# Patient Record
Sex: Female | Born: 1965 | Race: White | Hispanic: No | Marital: Married | Smoking: Never smoker
Health system: Southern US, Community
[De-identification: ages and names within clinical notes are randomized; demographics above are authoritative.]

## PROBLEM LIST (undated history)

## (undated) DIAGNOSIS — K219 Gastro-esophageal reflux disease without esophagitis: Secondary | ICD-10-CM

## (undated) DIAGNOSIS — IMO0001 Reserved for inherently not codable concepts without codable children: Secondary | ICD-10-CM

## (undated) HISTORY — DX: Gastro-esophageal reflux disease without esophagitis: K21.9

## (undated) HISTORY — PX: BREAST REDUCTION SURGERY: SHX8

## (undated) HISTORY — PX: GANGLION CYST EXCISION: SHX1691

## (undated) HISTORY — PX: GALLBLADDER SURGERY: SHX652

## (undated) HISTORY — DX: Reserved for inherently not codable concepts without codable children: IMO0001

---

## 1998-01-14 ENCOUNTER — Other Ambulatory Visit: Admission: RE | Admit: 1998-01-14 | Discharge: 1998-01-14 | Payer: Self-pay | Admitting: *Deleted

## 1999-09-07 ENCOUNTER — Other Ambulatory Visit: Admission: RE | Admit: 1999-09-07 | Discharge: 1999-09-07 | Payer: Self-pay | Admitting: *Deleted

## 2001-03-26 ENCOUNTER — Other Ambulatory Visit: Admission: RE | Admit: 2001-03-26 | Discharge: 2001-03-26 | Payer: Self-pay | Admitting: *Deleted

## 2004-02-17 ENCOUNTER — Emergency Department: Payer: Self-pay | Admitting: Emergency Medicine

## 2004-06-21 ENCOUNTER — Other Ambulatory Visit: Admission: RE | Admit: 2004-06-21 | Discharge: 2004-06-21 | Payer: Self-pay | Admitting: Obstetrics and Gynecology

## 2011-04-09 ENCOUNTER — Emergency Department: Payer: Self-pay | Admitting: *Deleted

## 2013-01-28 ENCOUNTER — Encounter: Payer: Self-pay | Admitting: *Deleted

## 2013-01-29 ENCOUNTER — Encounter: Payer: Self-pay | Admitting: Podiatry

## 2013-01-29 ENCOUNTER — Ambulatory Visit (INDEPENDENT_AMBULATORY_CARE_PROVIDER_SITE_OTHER): Payer: BC Managed Care – PPO | Admitting: Podiatry

## 2013-01-29 VITALS — BP 114/82 | HR 112 | Resp 16 | Ht 64.0 in | Wt 188.0 lb

## 2013-01-29 DIAGNOSIS — M722 Plantar fascial fibromatosis: Secondary | ICD-10-CM | POA: Insufficient documentation

## 2013-01-29 NOTE — Progress Notes (Signed)
Kathleen Parker presents today for followup of her plantar fasciitis status post 1 month. She relates that they were 100% better.  Objective: Pulses are palpable to the bilateral lower extremity +2/4 DP and PT. Neurologic sensorium is intact. She has no pain on palpation U. continue tubercles bilateral.  Assessment: Well-healing plantar fasciitis bilateral.  Plan: Continue all conservative therapies x1 month; including night splint, tennis shoes, meloxicam, ice therapy and stretching exercises. Followup with me on a when necessary basis.

## 2013-02-26 ENCOUNTER — Other Ambulatory Visit: Payer: Self-pay

## 2013-11-13 ENCOUNTER — Ambulatory Visit: Payer: Self-pay | Admitting: Unknown Physician Specialty

## 2015-07-20 ENCOUNTER — Other Ambulatory Visit: Payer: Self-pay | Admitting: Physician Assistant

## 2015-07-20 DIAGNOSIS — J329 Chronic sinusitis, unspecified: Secondary | ICD-10-CM

## 2015-07-22 ENCOUNTER — Other Ambulatory Visit: Payer: Self-pay

## 2015-10-19 ENCOUNTER — Ambulatory Visit (INDEPENDENT_AMBULATORY_CARE_PROVIDER_SITE_OTHER): Payer: BC Managed Care – PPO

## 2015-10-19 ENCOUNTER — Ambulatory Visit (INDEPENDENT_AMBULATORY_CARE_PROVIDER_SITE_OTHER): Payer: BC Managed Care – PPO | Admitting: Podiatry

## 2015-10-19 ENCOUNTER — Encounter: Payer: Self-pay | Admitting: Podiatry

## 2015-10-19 VITALS — BP 125/78 | HR 106 | Resp 16

## 2015-10-19 DIAGNOSIS — M722 Plantar fascial fibromatosis: Secondary | ICD-10-CM

## 2015-10-19 MED ORDER — MELOXICAM 15 MG PO TABS: 15.0000 mg | ORAL_TABLET | Freq: Every day | ORAL | Status: DC

## 2015-10-19 MED ORDER — METHYLPREDNISOLONE 4 MG PO TBPK: ORAL_TABLET | ORAL | Status: DC

## 2015-10-19 NOTE — Patient Instructions (Signed)

## 2015-10-19 NOTE — Progress Notes (Signed)
She presents today with a chief complaint of a painful right heel as well as the left but the left is more tolerable. She states been going on for the past 2-3 weeks now denies any trauma.  Objective: Vital signs are stable she's alert and oriented 3 she has pain on palpation medial tubercle bilateral. Radiographs demonstrate thickening of the plantar fascia and plantar distally oriented heel spur right foot greater than left  Assessment: Pain secondary plantar fasciitis right greater than left.  Plan: Injected the right heel today dispensed a plantar fascia brace requested that she continue to utilize her night splint but a prescription for Medrol Dosepak to be followed by meloxicam. I will follow-up with her in 1 month.

## 2015-11-16 ENCOUNTER — Ambulatory Visit: Payer: BC Managed Care – PPO | Admitting: Podiatry

## 2015-11-22 ENCOUNTER — Telehealth: Payer: Self-pay | Admitting: *Deleted

## 2015-11-22 MED ORDER — MELOXICAM 15 MG PO TABS: 15.0000 mg | ORAL_TABLET | Freq: Every day | ORAL | 1 refills | Status: DC

## 2015-11-22 MED ORDER — MELOXICAM 15 MG PO TABS: 15.0000 mg | ORAL_TABLET | Freq: Every day | ORAL | 0 refills | Status: DC

## 2015-11-22 NOTE — Telephone Encounter (Signed)
Request for meloxicam 10m #90 day supply received.  Dr. Al Corpus states refill #90, +1refill.

## 2020-03-10 ENCOUNTER — Other Ambulatory Visit: Payer: Self-pay

## 2020-03-10 ENCOUNTER — Ambulatory Visit (INDEPENDENT_AMBULATORY_CARE_PROVIDER_SITE_OTHER): Payer: BC Managed Care – PPO

## 2020-03-10 ENCOUNTER — Ambulatory Visit: Payer: BC Managed Care – PPO | Admitting: Podiatry

## 2020-03-10 ENCOUNTER — Encounter: Payer: Self-pay | Admitting: Podiatry

## 2020-03-10 DIAGNOSIS — M7662 Achilles tendinitis, left leg: Secondary | ICD-10-CM

## 2020-03-10 DIAGNOSIS — M7661 Achilles tendinitis, right leg: Secondary | ICD-10-CM

## 2020-03-10 MED ORDER — MELOXICAM 15 MG PO TABS: 15.0000 mg | ORAL_TABLET | Freq: Every day | ORAL | 3 refills | Status: DC

## 2020-03-10 MED ORDER — METHYLPREDNISOLONE 4 MG PO TBPK: ORAL_TABLET | ORAL | 0 refills | Status: AC

## 2020-03-10 NOTE — Progress Notes (Signed)
  Subjective:  Patient ID: Kathleen Parker, female    DOB: 02-12-1966,  MRN: [id number] HPI Chief Complaint  Patient presents with  . Foot Pain    Posterior heel/achilles bilateral - aching x 4 months, AM pain-stiffness, tried ibuprofen, stretching, decreased activity, and night splint  . New Patient (Initial Visit)    Est pt 2017    54 y.o. female presents with the above complaint.   ROS: She denies fever chills nausea vomiting muscle aches pains calf pain back pain chest pain shortness of breath.  States that this has been going on since 11-16-22 when her aunt died in [place] and she had to go down and start cleaning out her house.  Past Medical History:  Diagnosis Date  . Reflux    Past Surgical History:  Procedure Laterality Date  . BREAST REDUCTION SURGERY    . GALLBLADDER SURGERY    . GANGLION CYST EXCISION Right     Current Outpatient Medications:  .  meloxicam (MOBIC) 15 MG tablet, Take 1 tablet (15 mg total) by mouth daily., Disp: 30 tablet, Rfl: 3 .  methylPREDNISolone (MEDROL DOSEPAK) 4 MG TBPK tablet, 6 day dose pack - take as directed, Disp: 21 tablet, Rfl: 0  Allergies  Allergen Reactions  . Codeine Other (See Comments)    KEEPS AWAKE    Review of Systems Objective:  There were no vitals filed for this visit.  General: Well developed, nourished, in no acute distress, alert and oriented x3   Dermatological: Skin is warm, dry and supple bilateral. Nails x 10 are well maintained; remaining integument appears unremarkable at this time. There are no open sores, no preulcerative lesions, no rash or signs of infection present.  Vascular: Dorsalis Pedis artery and Posterior Tibial artery pedal pulses are 2/4 bilateral with immedate capillary fill time. Pedal hair growth present. No varicosities and no lower extremity edema present bilateral.   Neruologic: Grossly intact via light touch bilateral. Vibratory intact via tuning fork bilateral. Protective threshold with  Semmes Wienstein monofilament intact to all pedal sites bilateral. Patellar and Achilles deep tendon reflexes 2+ bilateral. No Babinski or clonus noted bilateral.   Musculoskeletal: No gross boney pedal deformities bilateral. No pain, crepitus, or limitation noted with foot and ankle range of motion bilateral. Muscular strength 5/5 in all groups tested bilateral.  She presents with pain on palpation to the posterior superior calcaneus the Achilles is thickened but the margins are intact there does not appear to be a void or tear.  There is no fluctuance subcutaneously that no noticed there is a small blister on the posterior aspect of her left heel does not appear to be infected.  Gait: Unassisted, Nonantalgic.    Radiographs:  Radiographs of the bilateral foot demonstrate an osseously mature individual with some retrocalcaneal heel spurs proximally oriented.  Thickening of the Achilles tendon at the posterior superior aspect of the calcaneus.  No intratendinous calcifications are noted.  Assessment & Plan:   Assessment: Achilles tendinitis bilateral.  Plan: Injected subcutaneously 2 mg of dexamethasone local anesthetic to the point of maximal tenderness.  Start her on a Medrol Dosepak to be followed by meloxicam.  Discussed appropriate shoe gear stretching exercise ice therapy and shoe gear modifications.  I will follow-up with her in 1 month.  She will call sooner with questions or concerns.     Kathleen Parker T. [place], [place]

## 2020-03-10 NOTE — Patient Instructions (Signed)

## 2020-04-12 ENCOUNTER — Ambulatory Visit: Payer: BC Managed Care – PPO | Admitting: Podiatry

## 2020-04-27 ENCOUNTER — Other Ambulatory Visit: Payer: Self-pay | Admitting: Obstetrics and Gynecology

## 2020-04-27 DIAGNOSIS — R928 Other abnormal and inconclusive findings on diagnostic imaging of breast: Secondary | ICD-10-CM

## 2020-05-04 ENCOUNTER — Other Ambulatory Visit: Payer: Self-pay

## 2020-05-04 ENCOUNTER — Ambulatory Visit
Admission: RE | Admit: 2020-05-04 | Discharge: 2020-05-04 | Disposition: A | Discharge status: Home / Self Care | Payer: BC Managed Care – PPO | Source: Ambulatory Visit | Attending: Obstetrics and Gynecology | Admitting: Obstetrics and Gynecology

## 2020-05-04 DIAGNOSIS — R928 Other abnormal and inconclusive findings on diagnostic imaging of breast: Secondary | ICD-10-CM

## 2020-10-18 ENCOUNTER — Ambulatory Visit: Payer: BC Managed Care – PPO | Admitting: Podiatry

## 2020-10-18 ENCOUNTER — Ambulatory Visit (INDEPENDENT_AMBULATORY_CARE_PROVIDER_SITE_OTHER): Payer: BC Managed Care – PPO

## 2020-10-18 ENCOUNTER — Other Ambulatory Visit: Payer: Self-pay

## 2020-10-18 ENCOUNTER — Encounter: Payer: Self-pay | Admitting: Podiatry

## 2020-10-18 DIAGNOSIS — S82892A Other fracture of left lower leg, initial encounter for closed fracture: Secondary | ICD-10-CM

## 2020-10-18 NOTE — Progress Notes (Signed)
Subjective:  Patient ID: Kathleen Parker, female    DOB: [birth year],  MRN: [id number]  Chief Complaint  Patient presents with   Foot Pain    Left ankle pain on the outside of the ankle  PT stated that on Sunday she fell down some stairs     55  y.o. female presents with the above complaint.  Patient presents with primary complaint of left ankle pain after she fell down some steps and had a lot of swelling outside of her ankle.  She has been able to walk on it without any acute issues.  She has mild pain but mostly swelling that she is concerned for.  She would like to get this evaluated.  She is known to Dr. and has been treated for Achilles tendinitis in the past.  Her date of injury happened this past SaturdayOn October 15, 2020.  She denies any other acute complaints and would like to discuss treatment options for this.  She did not go to the urgent care.  She denies any other polytrauma sites.   Review of Systems: Negative except as noted in the HPI. Denies N/V/F/Ch.  Past Medical History:  Diagnosis Date   Reflux     Current Outpatient Medications:    meloxicam (MOBIC) 15 MG tablet, Take 1 tablet (15 mg total) by mouth daily., Disp: 30 tablet, Rfl: 3   methylPREDNISolone (MEDROL DOSEPAK) 4 MG TBPK tablet, 6 day dose pack - take as directed, Disp: 21 tablet, Rfl: 0  Social History   Tobacco Use  Smoking Status Never  Smokeless Tobacco Never    Allergies  Allergen Reactions   Codeine Other (See Comments)    KEEPS AWAKE    Objective:  There were no vitals filed for this visit. There is no height or weight on file to calculate BMI. Constitutional Well developed. Well nourished.  Vascular Dorsalis pedis pulses palpable bilaterally. Posterior tibial pulses palpable bilaterally. Capillary refill normal to all digits.  No cyanosis or clubbing noted. Pedal hair growth normal.  Neurologic Normal speech. Oriented to person, place, and time. Epicritic sensation to  light touch grossly present bilaterally.  Dermatologic Nails well groomed and normal in appearance. No open wounds. No skin lesions.  Orthopedic: Pain on palpation to the left ankle.  Pain with range of motion of the ankle joint.  1+ nonpitting pitting edema noted.  Mild ecchymosis noted.  No fracture blisters noted.  Achilles tendon within normal limits.  No pain on the medial aspect of the ankle joint.   Radiographs: 3 views of skeletally mature adult left ankle.  SER 2 ankle fracture noted with spiral oblique fracture of the fibula.  Greater than 2 mm displacement.  No displacement noted of the medial malleolus site.  No gapping noted of the medial clear space.  Ankle mortise within normal limits. Assessment:   1. Ankle fracture, left, closed, initial encounter    Plan:  Patient was evaluated and treated and all questions answered.  Left SER 2 ankle fracture without increase in medial clear space -All questions and concerns were discussed with the patient in extensive detail.  I discussed with her that at this time we can manage conservatively and nonoperatively if there is no gapping in the syndesmosis as well as the fracture site.  At this time she is very swollen and therefore she will benefit from compression dressing.  Compression dressing was applied in standard technique. -She will ambulate in cam boot for next 2 weeks and we  will reassess and come in for repeat x-ray of the ankle.  If there is gapping or further displacement of the fibula or gapping of the syndesmosis we will discuss surgical options at that time.  I discussed this with the patient extensive detail she states understanding. -Meloxicam for pain control -  No follow-ups on file.

## 2020-11-01 ENCOUNTER — Ambulatory Visit (INDEPENDENT_AMBULATORY_CARE_PROVIDER_SITE_OTHER): Payer: BC Managed Care – PPO

## 2020-11-01 ENCOUNTER — Other Ambulatory Visit: Payer: Self-pay

## 2020-11-01 ENCOUNTER — Ambulatory Visit: Payer: BC Managed Care – PPO | Admitting: Podiatry

## 2020-11-01 DIAGNOSIS — S82892A Other fracture of left lower leg, initial encounter for closed fracture: Secondary | ICD-10-CM

## 2020-11-02 ENCOUNTER — Encounter: Payer: Self-pay | Admitting: Podiatry

## 2020-11-02 MED ORDER — DOXYCYCLINE HYCLATE 100 MG PO TABS: 100.0000 mg | ORAL_TABLET | Freq: Two times a day (BID) | ORAL | 0 refills | Status: AC

## 2020-11-02 NOTE — Progress Notes (Signed)
  Subjective:  Patient ID: Kathleen Parker, female    DOB: Aug 02, 1965,  MRN: [id number]  Chief Complaint  Patient presents with   Fracture    Left foot PT stated that she is doing well she stated that some of the swelling has gone down some she does have some numbness on the top of her foot that she is concerned about today.     55 y.o. female presents with the above complaint.  Patient presents with follow-up to left ankle fracture.  Patient states she is doing little bit better.  She has been noncompliant with management.  She states that there is some pain associated with it.  She denies any other acute complaints.  There is some redness and swelling as well.   Review of Systems: Negative except as noted in the HPI. Denies N/V/F/Ch.  Past Medical History:  Diagnosis Date   Reflux     Current Outpatient Medications:    meloxicam (MOBIC) 15 MG tablet, Take 1 tablet (15 mg total) by mouth daily., Disp: 30 tablet, Rfl: 3   methylPREDNISolone (MEDROL DOSEPAK) 4 MG TBPK tablet, 6 day dose pack - take as directed, Disp: 21 tablet, Rfl: 0  Social History   Tobacco Use  Smoking Status Never  Smokeless Tobacco Never    Allergies  Allergen Reactions   Codeine Other (See Comments)    KEEPS AWAKE    Objective:  There were no vitals filed for this visit. There is no height or weight on file to calculate BMI. Constitutional Well developed. Well nourished.  Vascular Dorsalis pedis pulses palpable bilaterally. Posterior tibial pulses palpable bilaterally. Capillary refill normal to all digits.  No cyanosis or clubbing noted. Pedal hair growth normal.  Neurologic Normal speech. Oriented to person, place, and time. Epicritic sensation to light touch grossly present bilaterally.  Dermatologic Nails well groomed and normal in appearance. No open wounds. No skin lesions.  Orthopedic: Pain on palpation to the left ankle.  Pain with range of motion of the ankle joint.  1+  nonpitting pitting edema noted.  Mild ecchymosis noted.  No fracture blisters noted.  Achilles tendon within normal limits.  No pain on the medial aspect of the ankle joint.  Mild redness and ecchymosis noted   Radiographs: 3 views of skeletally mature adult left ankle.  SER 2 ankle fracture noted with spiral oblique fracture of the fibula.  Greater than 2 mm displacement.  No further displacement noted of the medial malleolus site.  No further gapping noted of the medial clear space.  Ankle mortise within normal limits. Assessment:   1. Ankle fracture, left, closed, initial encounter    Plan:  Patient was evaluated and treated and all questions answered.  Left SER 2 ankle fracture without increase in medial clear space -All questions and concerns were discussed with the patient in extensive detail.   -Continue ice and compression. -Repeat x-ray does not show any kind of instability.  There is no further displacement noted.  At this time we will discuss conservative treatment options with nonweightbearing to the left lower extremity with Cam boot. -I discussed with the patient extensive detail intake 8 to 12 weeks to completely heal.  Patient states understanding -Doxycycline was dispensed for skin and soft tissue prophylaxis  No follow-ups on file.

## 2020-12-15 ENCOUNTER — Ambulatory Visit: Payer: BC Managed Care – PPO | Admitting: Podiatry

## 2020-12-15 ENCOUNTER — Ambulatory Visit (INDEPENDENT_AMBULATORY_CARE_PROVIDER_SITE_OTHER): Payer: BC Managed Care – PPO

## 2020-12-15 ENCOUNTER — Other Ambulatory Visit: Payer: Self-pay

## 2020-12-15 DIAGNOSIS — S82892A Other fracture of left lower leg, initial encounter for closed fracture: Secondary | ICD-10-CM

## 2020-12-16 ENCOUNTER — Telehealth: Payer: Self-pay

## 2020-12-16 NOTE — Telephone Encounter (Signed)
Patient called, she was seen yesterday and you took her out of her boot.  She wanted to know when she could start going back to the gym    Please advise.  She knows it will be Monday before I will get back with her.  Thanks

## 2020-12-19 NOTE — Telephone Encounter (Signed)
Patient has been notified of Dr. Eliane Decree instructions for going back to the gym.  She verbalized understanding

## 2020-12-20 NOTE — Progress Notes (Signed)
  Subjective:  Patient ID: Kathleen Parker, female    DOB: 1965-12-16,  MRN: [id number]  Chief Complaint  Patient presents with   Fracture    Left foot fracture PT stated that she still has some swelling    55 y.o. female presents with the above complaint.  Patient presents with follow-up to left ankle fracture.  Patient states she is doing a lot better.  No pain.  She has been ambulating with a cam boot for the last few weeks.   Review of Systems: Negative except as noted in the HPI. Denies N/V/F/Ch.  Past Medical History:  Diagnosis Date   Reflux     Current Outpatient Medications:    doxycycline (VIBRA-TABS) 100 MG tablet, Take 1 tablet (100 mg total) by mouth 2 (two) times daily., Disp: 10 tablet, Rfl: 0   meloxicam (MOBIC) 15 MG tablet, Take 1 tablet (15 mg total) by mouth daily., Disp: 30 tablet, Rfl: 3   methylPREDNISolone (MEDROL DOSEPAK) 4 MG TBPK tablet, 6 day dose pack - take as directed, Disp: 21 tablet, Rfl: 0  Social History   Tobacco Use  Smoking Status Never  Smokeless Tobacco Never    Allergies  Allergen Reactions   Codeine Other (See Comments)    KEEPS AWAKE    Objective:  There were no vitals filed for this visit. There is no height or weight on file to calculate BMI. Constitutional Well developed. Well nourished.  Vascular Dorsalis pedis pulses palpable bilaterally. Posterior tibial pulses palpable bilaterally. Capillary refill normal to all digits.  No cyanosis or clubbing noted. Pedal hair growth normal.  Neurologic Normal speech. Oriented to person, place, and time. Epicritic sensation to light touch grossly present bilaterally.  Dermatologic Nails well groomed and normal in appearance. No open wounds. No skin lesions.  Orthopedic: Pain on palpation to the left ankle.  Pain with range of motion of the ankle joint.  1+ nonpitting pitting edema noted.  Mild ecchymosis noted.  No fracture blisters noted.  Achilles tendon within normal  limits.  No pain on the medial aspect of the ankle joint.  Mild redness and ecchymosis noted   Radiographs: 3 views of skeletally mature adult left ankle.  SER 2 ankle fracture noted with spiral oblique fracture of the fibula.  Consolidation noted across the fracture site.  Callus formation noted.  No further gapping of the medial clear space noted. Assessment:   1. Ankle fracture, left, closed, initial encounter    Plan:  Patient was evaluated and treated and all questions answered.  Left SER 2 ankle fracture without increase in medial clear space -All questions and concerns were discussed with the patient in extensive detail.   -Continue ice and compression. -Repeat x-ray shows continued signs of healing without any instability.  She can start transitioning to weightbearing as tolerated in regular shoes.  The fracture should be stable at this point.  Clinically she does not have any pain therefore I feel comfortable transitioning her. -I discussed with her if any foot and ankle issues I will place her self back in the boot.  She states understanding No follow-ups on file.

## 2021-03-01 ENCOUNTER — Telehealth: Payer: Self-pay | Admitting: Podiatry

## 2021-03-01 MED ORDER — MELOXICAM 15 MG PO TABS: 15.0000 mg | ORAL_TABLET | Freq: Every day | ORAL | 3 refills | Status: DC

## 2021-03-01 NOTE — Telephone Encounter (Signed)
Patient called asking for RX refill for Meloxican. Patient uses [address] in [place]. Patients number is [phone].

## 2021-03-10 ENCOUNTER — Telehealth: Payer: Self-pay | Admitting: Podiatry

## 2021-03-10 NOTE — Telephone Encounter (Signed)
Patient called stating she wants a Meloxicam refill. Patient uses [facility] in [place]. Pt tele number [phone].

## 2021-04-25 ENCOUNTER — Ambulatory Visit: Payer: BC Managed Care – PPO | Admitting: Podiatry

## 2021-05-05 IMAGING — US US BREAST*L* LIMITED INC AXILLA
1 series · 7 of 7 positions shown · non-contrast
Comparison: Previous exams including recent screening mammogram
dated 04/25/2020.

CLINICAL DATA: Patient returns today to evaluate a possible LEFT
breast mass identified on recent screening mammogram.

EXAM:
DIGITAL DIAGNOSTIC LEFT MAMMOGRAM WITH CAD AND TOMO
ULTRASOUND LEFT BREAST

[Series 1: us breast*left* limited inc axilla · 0.06mm/px · 7 of 7 slices shown]
[im 1/7]
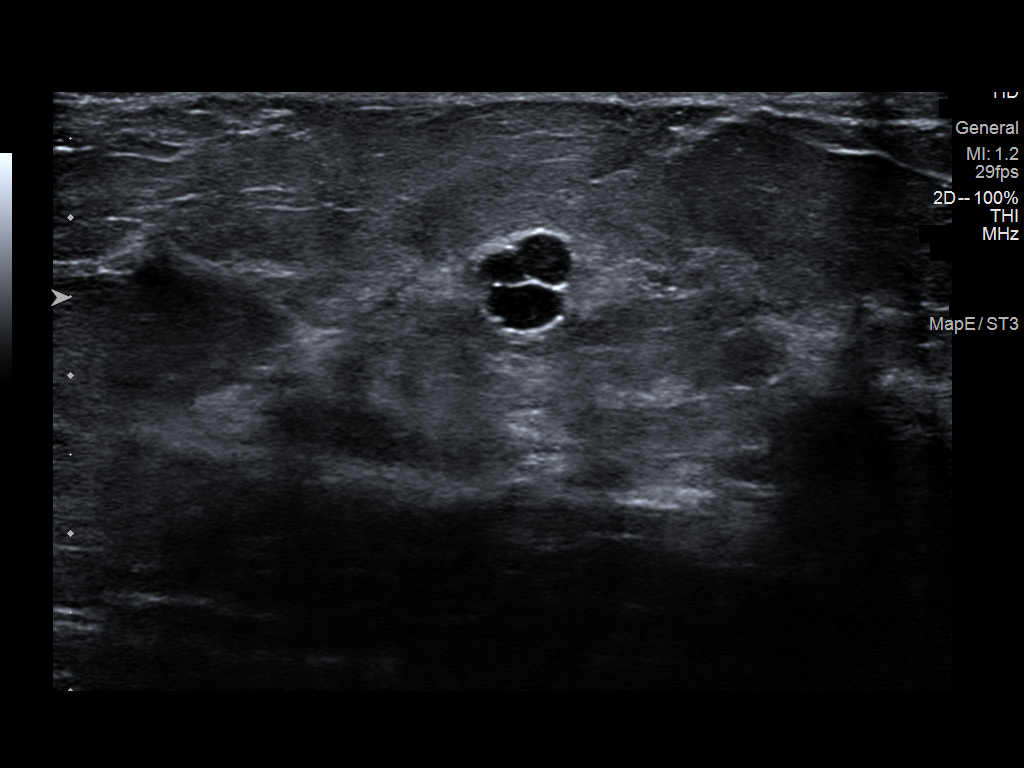
[im 2/7]
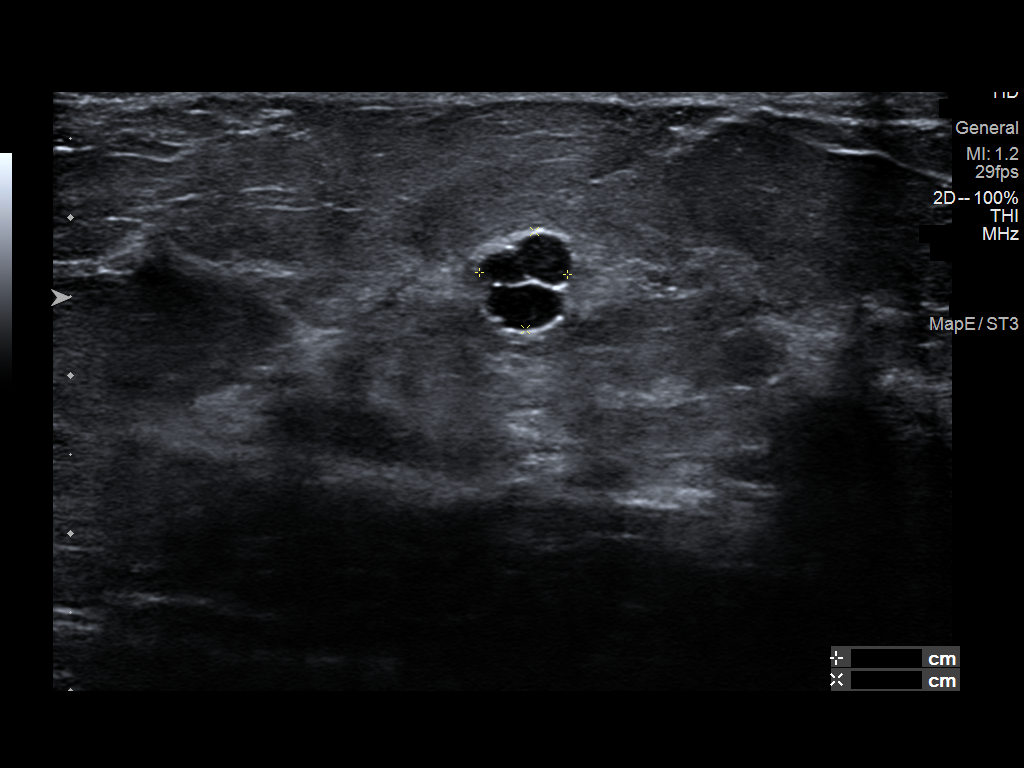
[im 3/7]
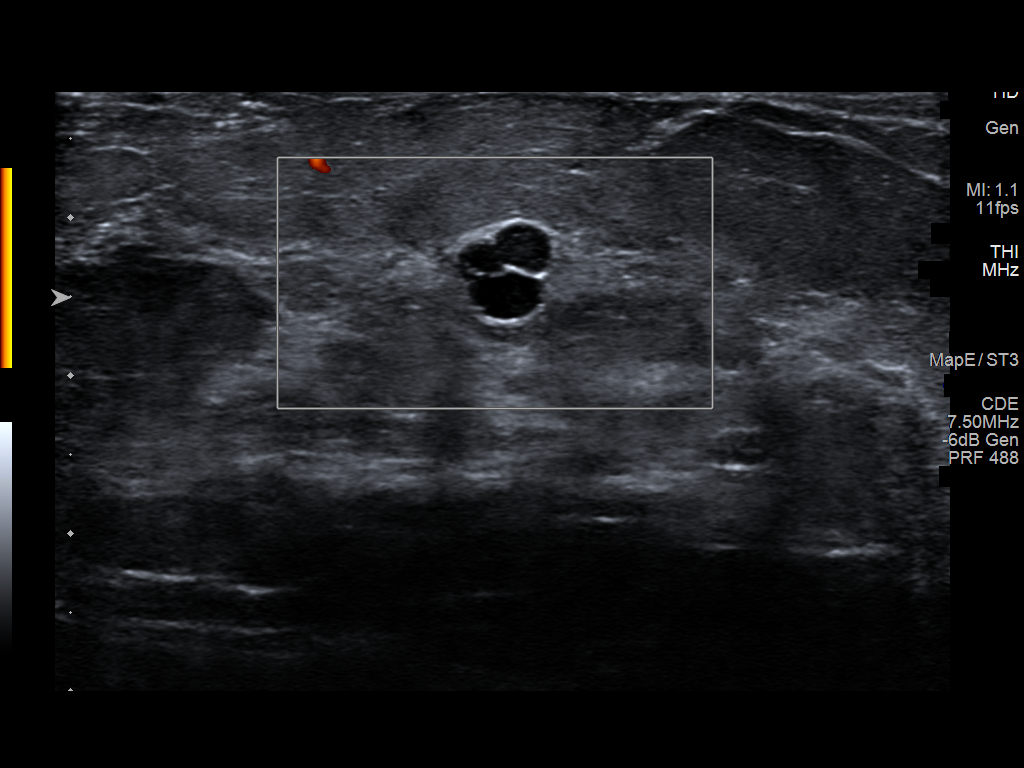
[im 4/7]
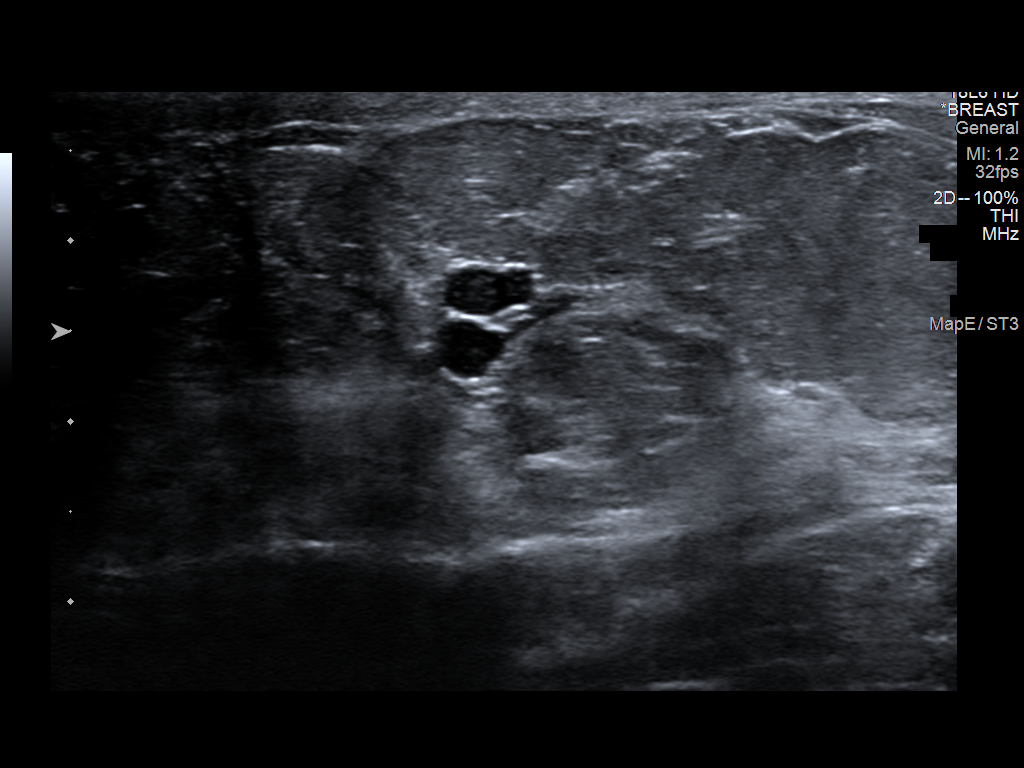
[im 5/7]
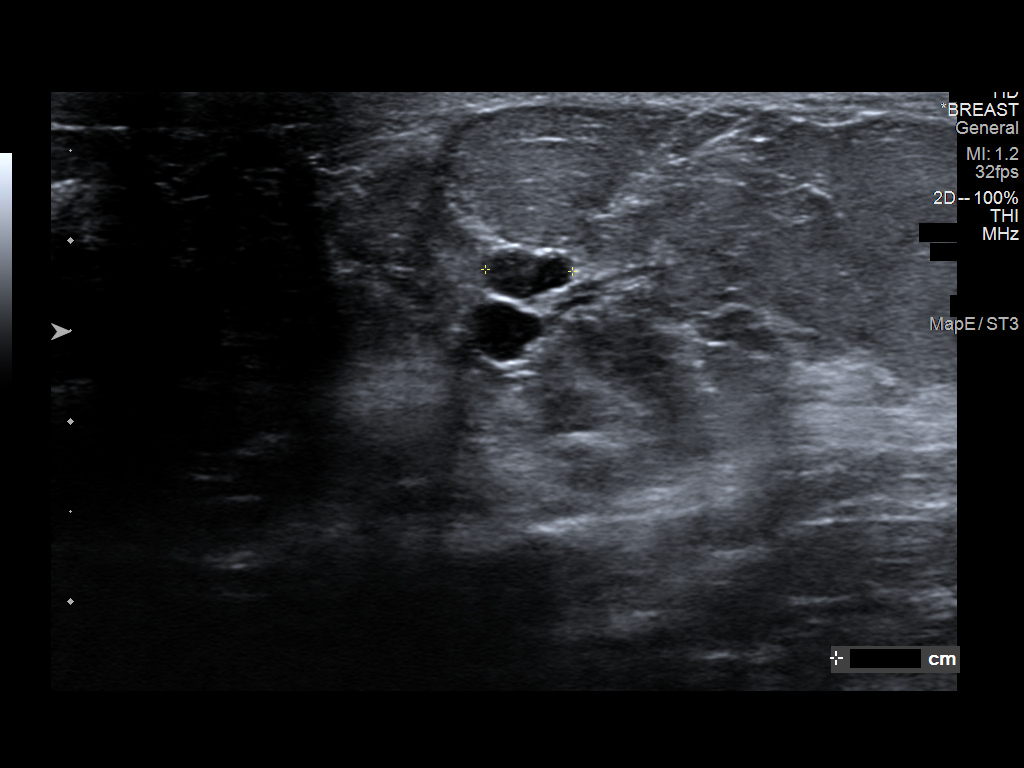
[im 6/7]
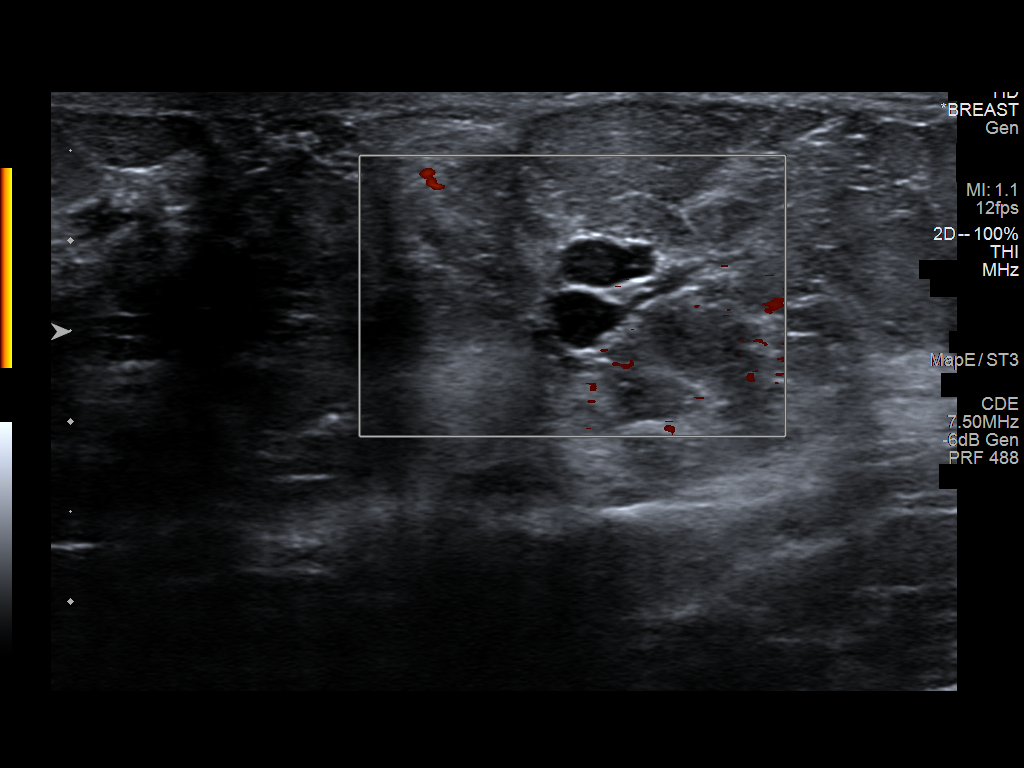
[im 7/7]
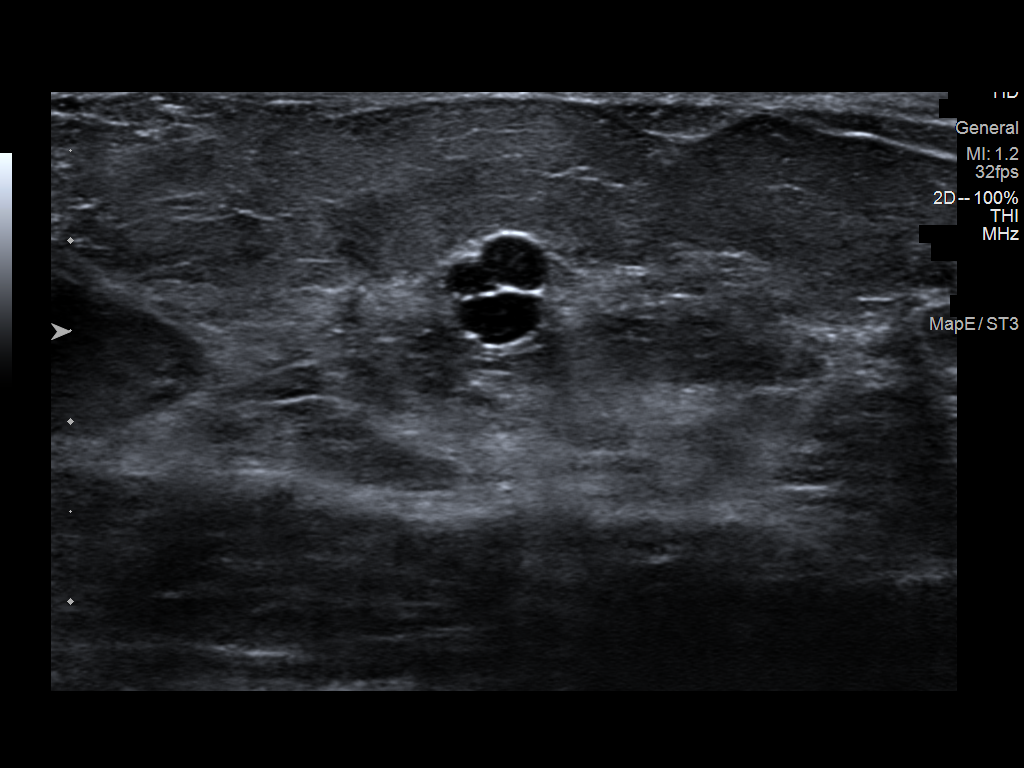

[7 of 7 positions shown; findings below may reference images not displayed]

ACR Breast Density Category b: There are scattered areas of
fibroglandular density.
FINDINGS: An oval circumscribed mass is confirmed within the lower LEFT
breast, at anterior depth, 6-7 o'clock axis region, measuring
approximately 8 mm greatest dimension.

Mammographic images were processed with CAD.

Targeted ultrasound is performed, showing a benign cluster of cysts
in the LEFT breast at the 6 o'clock axis, retroareolar, measuring 6
mm, corresponding to the mammographic finding.
IMPRESSION: Benign cluster of cysts within the LEFT breast at the 6 o'clock
axis, measuring 6 mm, corresponding to the mammographic finding.

Patient may return to routine annual bilateral screening mammogram
schedule.

RECOMMENDATION:
Screening mammogram in one year.(Code:QT-1-1AZ)

I have discussed the findings and recommendations with the patient.
If applicable, a reminder letter will be sent to the patient
regarding the next appointment.

BI-RADS CATEGORY  2: Benign.

## 2021-05-05 IMAGING — MG MM DIGITAL DIAGNOSTIC UNILAT*L* W/ TOMO W/ CAD
6 series · 6 of 18 positions shown · non-contrast
Comparison: Previous exams including recent screening mammogram
dated 04/25/2020.

CLINICAL DATA: Patient returns today to evaluate a possible LEFT
breast mass identified on recent screening mammogram.

EXAM:
DIGITAL DIAGNOSTIC LEFT MAMMOGRAM WITH CAD AND TOMO
ULTRASOUND LEFT BREAST

[L CC synth-2D (1 of 2)]
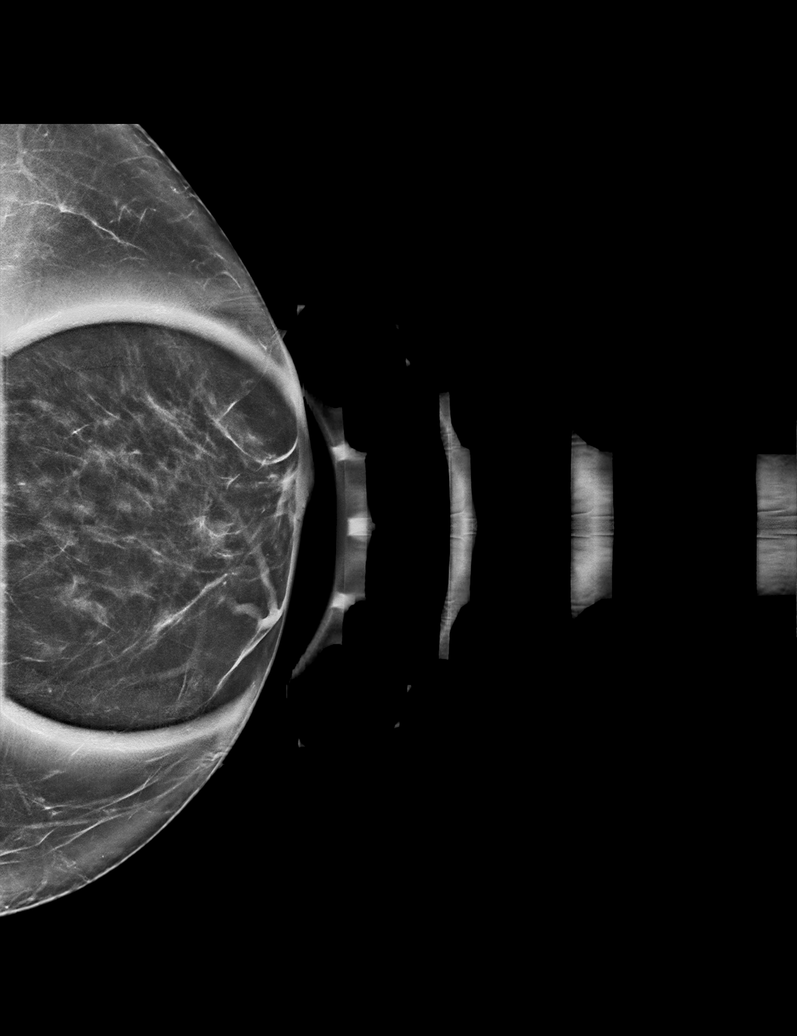

[L MLO synth-2D]
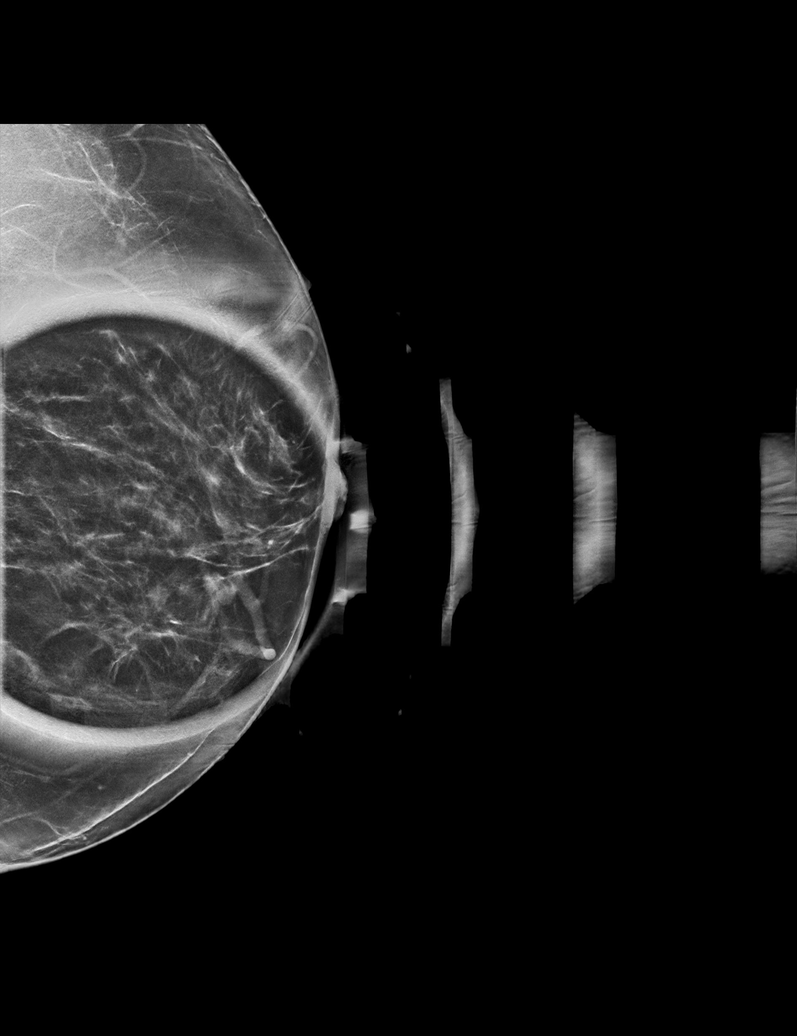

[L CC synth-2D (2 of 2)]
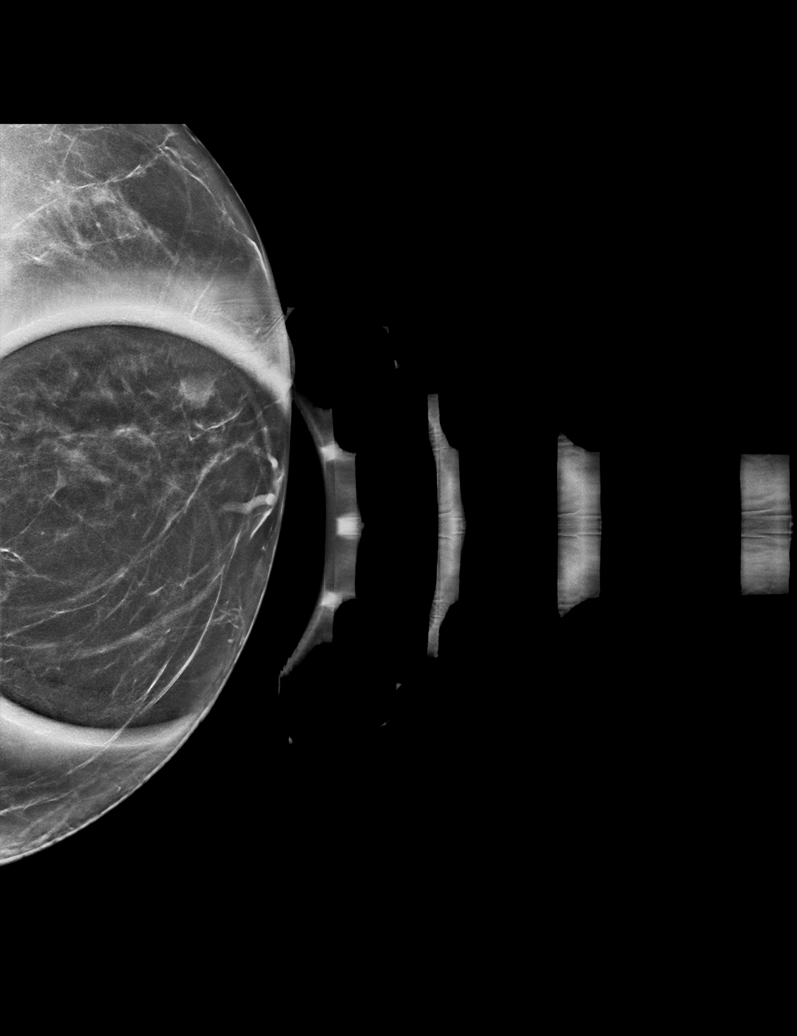

[L CC tomo (1 of 2) · tomo slice 33/64.0]
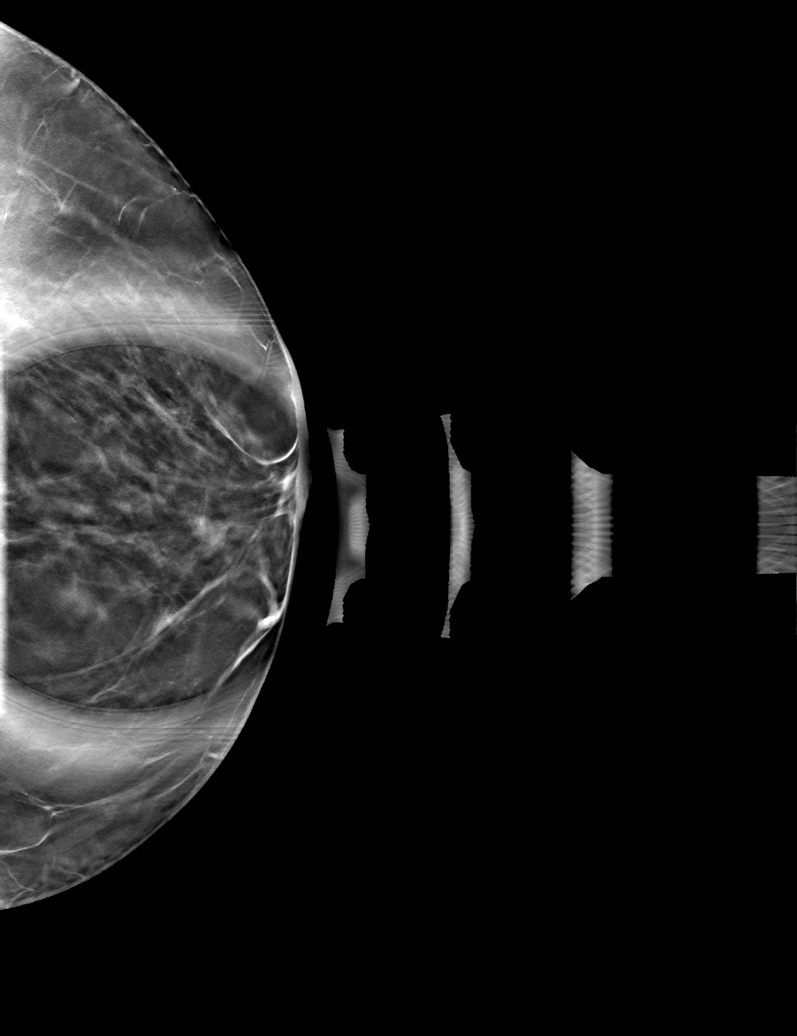

[L MLO tomo · tomo slice 35/68.0]
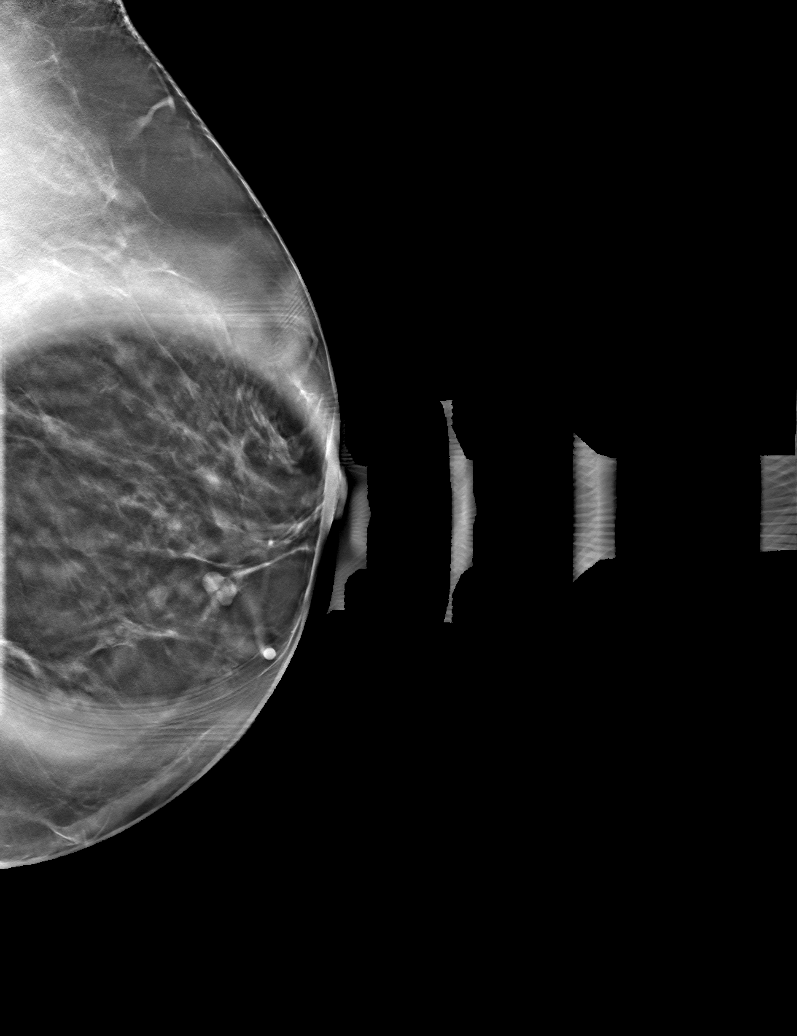

[L CC tomo (2 of 2) · tomo slice 29/56.0]
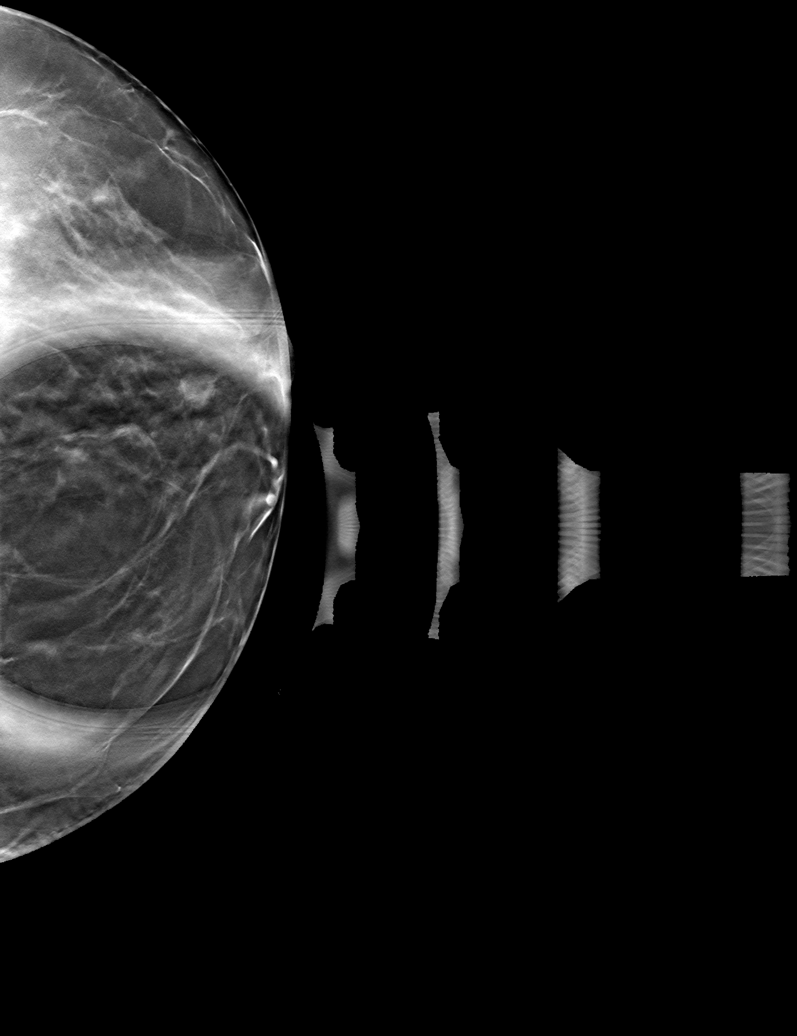

[6 of 18 positions shown; findings below may reference images not displayed]

ACR Breast Density Category b: There are scattered areas of
fibroglandular density.
FINDINGS: An oval circumscribed mass is confirmed within the lower LEFT
breast, at anterior depth, 6-7 o'clock axis region, measuring
approximately 8 mm greatest dimension.

Mammographic images were processed with CAD.

Targeted ultrasound is performed, showing a benign cluster of cysts
in the LEFT breast at the 6 o'clock axis, retroareolar, measuring 6
mm, corresponding to the mammographic finding.
IMPRESSION: Benign cluster of cysts within the LEFT breast at the 6 o'clock
axis, measuring 6 mm, corresponding to the mammographic finding.

Patient may return to routine annual bilateral screening mammogram
schedule.

RECOMMENDATION:
Screening mammogram in one year.(Code:QT-1-1AZ)

I have discussed the findings and recommendations with the patient.
If applicable, a reminder letter will be sent to the patient
regarding the next appointment.

BI-RADS CATEGORY  2: Benign.

## 2021-06-03 ENCOUNTER — Other Ambulatory Visit: Payer: Self-pay | Admitting: Podiatry

## 2021-10-06 ENCOUNTER — Other Ambulatory Visit: Payer: Self-pay | Admitting: Podiatry

## 2022-01-25 ENCOUNTER — Other Ambulatory Visit: Payer: Self-pay | Admitting: Obstetrics and Gynecology

## 2022-01-25 DIAGNOSIS — R928 Other abnormal and inconclusive findings on diagnostic imaging of breast: Secondary | ICD-10-CM

## 2022-02-05 ENCOUNTER — Ambulatory Visit
Admission: RE | Admit: 2022-02-05 | Discharge: 2022-02-05 | Disposition: A | Discharge status: Home / Self Care | Payer: BC Managed Care – PPO | Source: Ambulatory Visit | Attending: Obstetrics and Gynecology | Admitting: Obstetrics and Gynecology

## 2022-02-05 ENCOUNTER — Ambulatory Visit: Payer: BC Managed Care – PPO

## 2022-02-05 DIAGNOSIS — R928 Other abnormal and inconclusive findings on diagnostic imaging of breast: Secondary | ICD-10-CM
